# Patient Record
Sex: Male | Born: 1995 | Race: White | Hispanic: No | Marital: Single | Smoking: Never smoker
Health system: Southern US, Community
[De-identification: ages and names within clinical notes are randomized; demographics above are authoritative.]

---

## 2017-12-15 ENCOUNTER — Encounter (HOSPITAL_BASED_OUTPATIENT_CLINIC_OR_DEPARTMENT_OTHER): Payer: Self-pay | Admitting: Emergency Medicine

## 2017-12-15 ENCOUNTER — Emergency Department (HOSPITAL_BASED_OUTPATIENT_CLINIC_OR_DEPARTMENT_OTHER)
Admission: EM | Admit: 2017-12-15 | Discharge: 2017-12-15 | Disposition: A | Discharge status: Home / Self Care | Payer: BC Managed Care – PPO | Attending: Emergency Medicine | Admitting: Emergency Medicine

## 2017-12-15 ENCOUNTER — Emergency Department (HOSPITAL_BASED_OUTPATIENT_CLINIC_OR_DEPARTMENT_OTHER): Payer: BC Managed Care – PPO

## 2017-12-15 ENCOUNTER — Other Ambulatory Visit: Payer: Self-pay

## 2017-12-15 DIAGNOSIS — R1310 Dysphagia, unspecified: Secondary | ICD-10-CM | POA: Insufficient documentation

## 2017-12-15 DIAGNOSIS — R0989 Other specified symptoms and signs involving the circulatory and respiratory systems: Secondary | ICD-10-CM | POA: Diagnosis present

## 2017-12-15 MED ORDER — GI COCKTAIL ~~LOC~~
30.0000 mL | Freq: Once | ORAL | Status: AC
  Administered 2017-12-15: 30 mL via ORAL
  Filled 2017-12-15: qty 30

## 2017-12-15 MED ORDER — FAMOTIDINE 20 MG PO TABS: 20.0000 mg | ORAL_TABLET | Freq: Two times a day (BID) | ORAL | 0 refills | Status: AC

## 2017-12-15 MED ORDER — SUCRALFATE 1 GM/10ML PO SUSP: 1.0000 g | Freq: Three times a day (TID) | ORAL | 0 refills | Status: AC

## 2017-12-15 NOTE — Discharge Instructions (Signed)
Please read and follow all provided instructions.  Your diagnoses today include:  1. Odynophagia    Tests performed today include:  Chest x-ray -no problems  Vital signs. See below for your results today.   Medications prescribed:   Carafate - for stomach upset and to protect your stomach   Pepcid (famotidine) - antihistamine  You can find this medication over-the-counter.   DO NOT exceed:   20mg  Pepcid every 12 hours  Take any prescribed medications only as directed.  Home care instructions:  Follow any educational materials contained in this packet.  BE VERY CAREFUL not to take multiple medicines containing Tylenol (also called acetaminophen). Doing so can lead to an overdose which can damage your liver and cause liver failure and possibly death.   Follow-up instructions: Please follow-up with your primary care provider in the next 3 days for further evaluation of your symptoms.   Return instructions:   Please return to the Emergency Department if you experience worsening symptoms.   Please return if you have any other emergent concerns.  Additional Information:  Your vital signs today were: BP 132/72 (BP Location: Left Arm)    Pulse (!) 102    Temp 98.7 F (37.1 C) (Oral)    Resp 16    Ht 6' (1.829 m)    Wt 79.4 kg (175 lb)    SpO2 100%    BMI 23.73 kg/m  If your blood pressure (BP) was elevated above 135/85 this visit, please have this repeated by your doctor within one month. --------------

## 2017-12-15 NOTE — ED Provider Notes (Signed)
[facility] EMERGENCY DEPARTMENT Provider Note   CSN: [id number] Arrival date & time: 12/15/17  1051     History   Chief Complaint Chief Complaint  Patient presents with  . Swallowed Foreign Body    HPI Edward Lam is a 22 y.o. male.  Patient presents today with complaint of irritation with swallowing and states that he had acute onset of discomfort 2 days ago when he swallowed a multivitamin.  Since that time he has had a foreign body sensation in his throat with discomfort with swallowing.  He denies any nausea or vomiting.  No regurgitation.  No difficulty breathing, coughing, or wheezing.  No fevers or cough.  No changes in his bowel movements or blood in the stool.  No treatments prior to arrival.      History reviewed. No pertinent past medical history.  There are no active problems to display for this patient.   History reviewed. No pertinent surgical history.     Home Medications    Prior to Admission medications   Not on File    Family History History reviewed. No pertinent family history.  Social History Social History   Tobacco Use  . Smoking status: Never Smoker  . Smokeless tobacco: Never Used  Substance Use Topics  . Alcohol use: No    Frequency: Never  . Drug use: No     Allergies   Patient has no known allergies.   Review of Systems Review of Systems  Constitutional: Negative for fever.  HENT: Positive for trouble swallowing. Negative for rhinorrhea and sore throat.   Eyes: Negative for redness.  Respiratory: Negative for cough.   Cardiovascular: Positive for chest pain.  Gastrointestinal: Negative for abdominal pain, diarrhea, nausea and vomiting.  Genitourinary: Negative for dysuria.  Musculoskeletal: Negative for myalgias.  Skin: Negative for rash.  Neurological: Negative for headaches.     Physical Exam Updated Vital Signs BP 132/72 (BP Location: Left Arm)   Pulse (!) 102   Temp 98.7 F (37.1 C) (Oral)    Resp 16   Ht 6' (1.829 m)   Wt 79.4 kg (175 lb)   SpO2 100%   BMI 23.73 kg/m   Physical Exam  Constitutional: He appears well-developed and well-nourished.  HENT:  Head: Normocephalic and atraumatic.  Mouth/Throat: Oropharynx is clear and moist.  Eyes: Conjunctivae are normal. Right eye exhibits no discharge. Left eye exhibits no discharge.  Neck: Normal range of motion. Neck supple.  Cardiovascular: Normal rate, regular rhythm and normal heart sounds.  No murmur heard. Pulmonary/Chest: Effort normal and breath sounds normal. No stridor. No respiratory distress. He has no wheezes.  Abdominal: Soft. There is no tenderness.  Neurological: He is alert.  Skin: Skin is warm and dry.  Psychiatric: He has a normal mood and affect.  Nursing note and vitals reviewed.    ED Treatments / Results  Labs (all labs ordered are listed, but only abnormal results are displayed) Labs Reviewed - No data to display  EKG  EKG Interpretation None       Radiology No results found.  Procedures Procedures (including critical care time)  Medications Ordered in ED Medications  gi cocktail (Maalox,Lidocaine,Donnatal) (30 mLs Oral Given 12/15/17 1200)     Initial Impression / Assessment and Plan / ED Course  I have reviewed the triage vital signs and the nursing notes.  Pertinent labs & imaging results that were available during my care of the patient were reviewed by me and considered in my  medical decision making (see chart for details).     Patient seen and examined. Work-up initiated. Medications ordered.   Vital signs reviewed and are as follows: BP 132/72 (BP Location: Left Arm)   Pulse (!) 102   Temp 98.7 F (37.1 C) (Oral)   Resp 16   Ht 6' (1.829 m)   Wt 79.4 kg (175 lb)   SpO2 100%   BMI 23.73 kg/m   12:26 PM chest x-ray reassuring.  Patient had total relief of symptoms with GI cocktail.  Discussed possible etiologies.  Discussed use of Carafate and H2 blocker.   We will discharged home with prescriptions for these.  Encouraged return to emergency department with worsening pain, difficulty swallowing, vomiting, new symptoms or other concerns.  Final Clinical Impressions(s) / ED Diagnoses   Final diagnoses:  Odynophagia   Patient with painful swallowing after taking a large multivitamin.  Possible minor esophageal spasm.  Less likely pill esophagitis.  No signs of perforation on chest x-ray.  Patient is well-appearing and handling secretions.  No difficulty with eating or drinking.  ED Discharge Orders        Ordered    sucralfate (CARAFATE) 1 GM/10ML suspension  3 times daily with meals & bedtime     12/15/17 1221    famotidine (PEPCID) 20 MG tablet  2 times daily     12/15/17 1221       Renne Crigler, PA-C 12/15/17 1227    Little, Ambrose [place], MD 12/16/17 [phone]

## 2017-12-15 NOTE — ED Triage Notes (Signed)
Patient states that he swollowed a MVI about 2-3 days ago and he states that he can still feel it in his mid epigastric region. The patient is in no noted distress.

## 2019-07-18 IMAGING — CR DG CHEST 2V
3 series · 3 of 3 positions shown · non-contrast
Comparison: None.

CLINICAL DATA: Pain with swallowing after taking multivitamin
several days ago.

EXAM:
CHEST  2 VIEW

[w chest pa (1 of 2)]
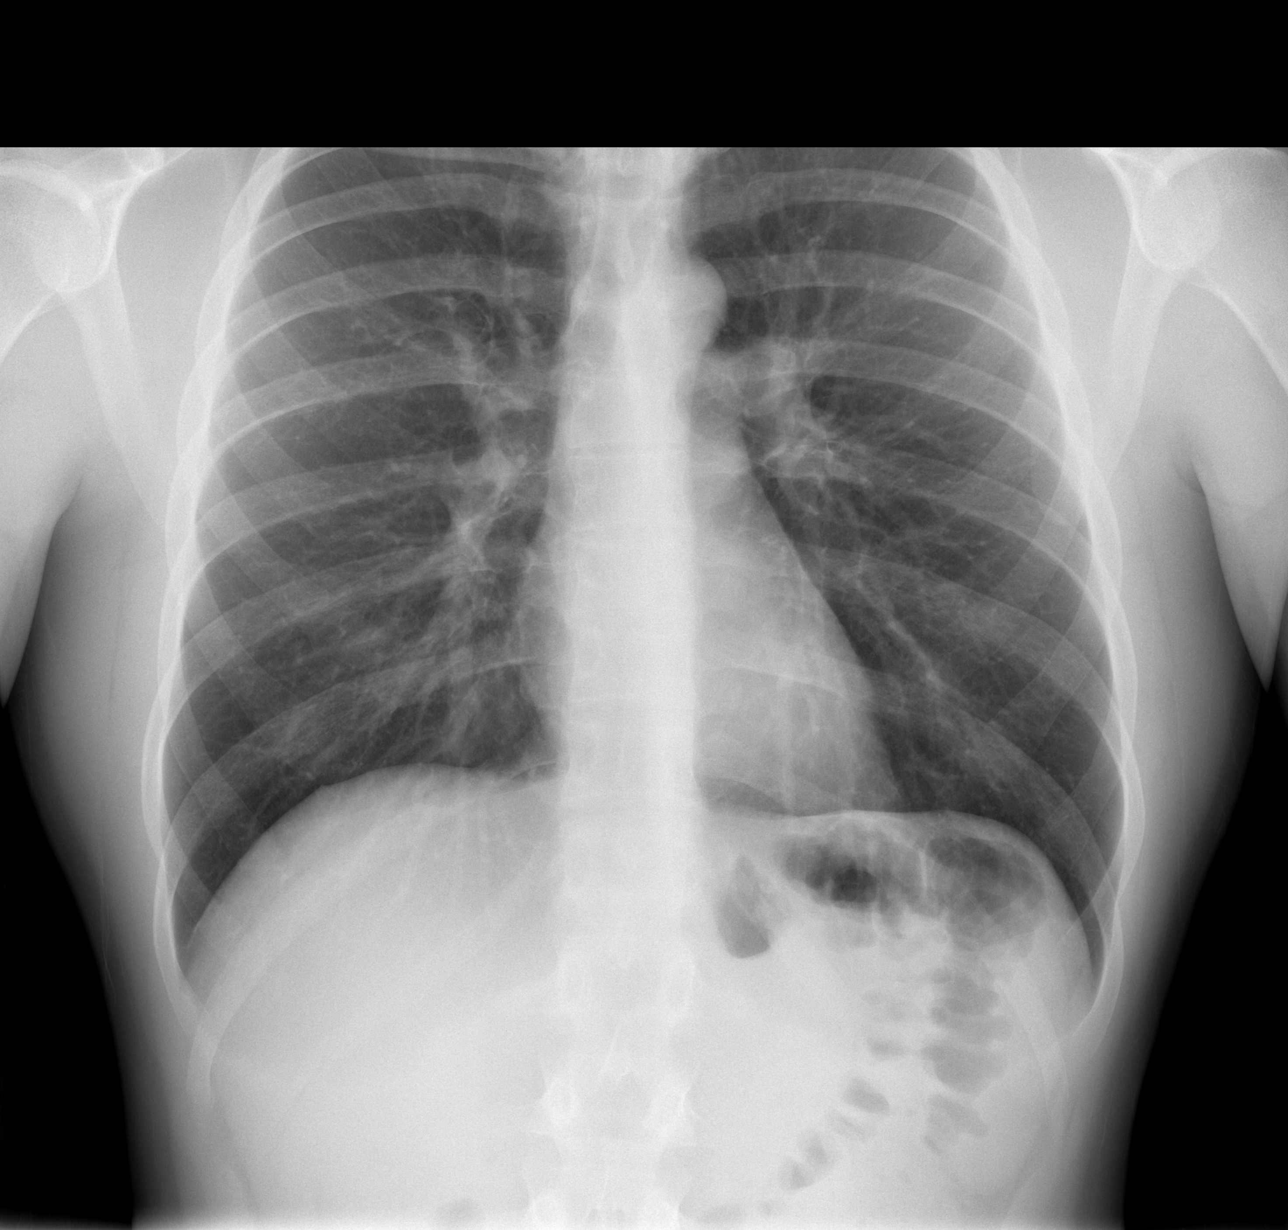

[w chest pa (2 of 2)]
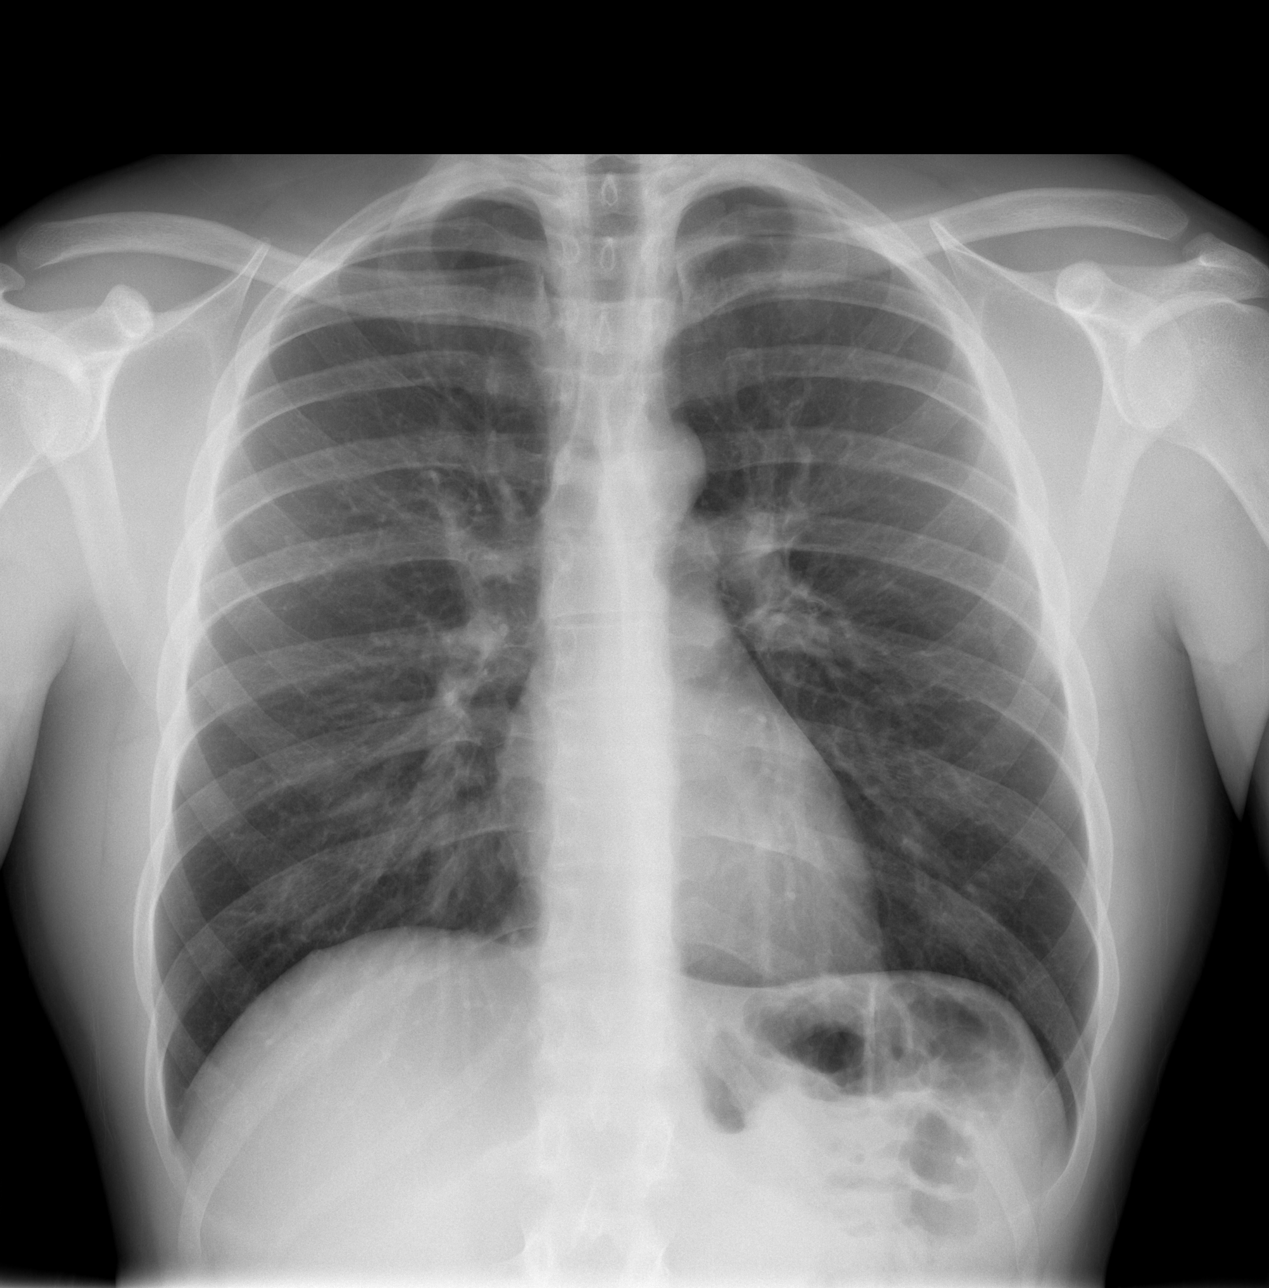

[w chest lat]
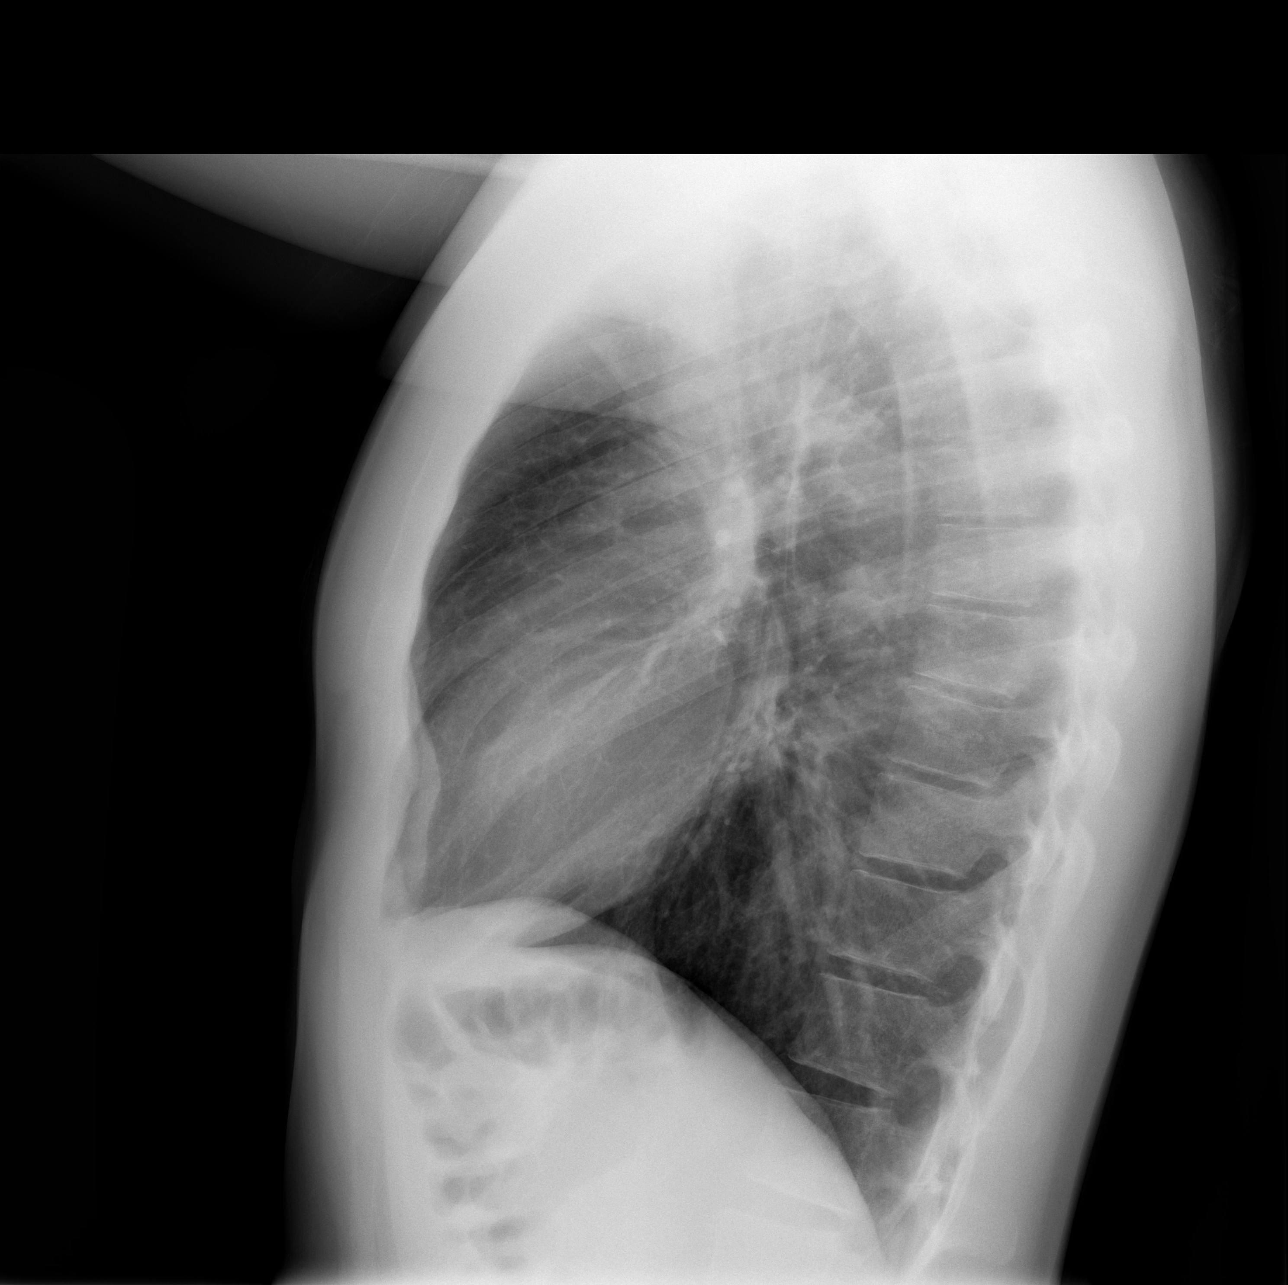

[3 of 3 positions shown; findings below may reference images not displayed]

FINDINGS: The heart size and mediastinal contours are within normal limits.
Both lungs are clear. The visualized skeletal structures are
unremarkable. No radiopaque foreign body.
IMPRESSION: Normal chest x-ray.
